# Patient Record
Sex: Female | Born: 1970 | Race: White | Hispanic: No | Marital: Married
Health system: Southern US, Community
[De-identification: ages and names within clinical notes are randomized; demographics above are authoritative.]

---

## 2006-03-09 ENCOUNTER — Emergency Department: Payer: Self-pay | Admitting: Emergency Medicine

## 2006-06-08 ENCOUNTER — Emergency Department: Payer: Self-pay | Admitting: Emergency Medicine

## 2006-09-28 ENCOUNTER — Emergency Department: Payer: Self-pay | Admitting: Emergency Medicine

## 2006-11-10 ENCOUNTER — Other Ambulatory Visit: Payer: Self-pay

## 2006-11-10 ENCOUNTER — Ambulatory Visit: Payer: Self-pay | Admitting: Family Medicine

## 2006-11-10 ENCOUNTER — Emergency Department: Payer: Self-pay | Admitting: Emergency Medicine

## 2007-02-02 ENCOUNTER — Inpatient Hospital Stay: Payer: Self-pay | Admitting: Internal Medicine

## 2007-02-02 ENCOUNTER — Other Ambulatory Visit: Payer: Self-pay

## 2007-02-03 ENCOUNTER — Inpatient Hospital Stay: Payer: Self-pay

## 2007-02-09 ENCOUNTER — Other Ambulatory Visit: Payer: Self-pay

## 2007-02-09 ENCOUNTER — Inpatient Hospital Stay: Payer: Self-pay | Admitting: Unknown Physician Specialty

## 2007-02-10 ENCOUNTER — Inpatient Hospital Stay: Payer: Self-pay | Admitting: Psychiatry

## 2007-06-06 ENCOUNTER — Emergency Department: Payer: Self-pay | Admitting: Emergency Medicine

## 2008-04-18 ENCOUNTER — Observation Stay: Payer: Self-pay | Admitting: Unknown Physician Specialty

## 2008-07-11 ENCOUNTER — Emergency Department: Payer: Self-pay | Admitting: Emergency Medicine

## 2009-02-20 ENCOUNTER — Emergency Department: Payer: Self-pay | Admitting: Internal Medicine

## 2009-06-26 ENCOUNTER — Emergency Department: Payer: Self-pay | Admitting: Emergency Medicine

## 2009-09-01 ENCOUNTER — Emergency Department: Payer: Self-pay | Admitting: Emergency Medicine

## 2009-10-07 ENCOUNTER — Emergency Department: Payer: Self-pay | Admitting: Emergency Medicine

## 2009-11-01 ENCOUNTER — Emergency Department: Payer: Self-pay | Admitting: Emergency Medicine

## 2009-12-08 ENCOUNTER — Ambulatory Visit: Payer: Self-pay

## 2010-02-02 ENCOUNTER — Ambulatory Visit: Payer: Self-pay

## 2010-06-13 ENCOUNTER — Emergency Department: Payer: Self-pay | Admitting: Emergency Medicine

## 2010-07-08 ENCOUNTER — Emergency Department: Payer: Self-pay | Admitting: Emergency Medicine

## 2010-10-05 ENCOUNTER — Emergency Department: Payer: Self-pay | Admitting: Emergency Medicine

## 2010-11-05 ENCOUNTER — Ambulatory Visit: Payer: Self-pay | Admitting: Unknown Physician Specialty

## 2010-12-09 ENCOUNTER — Encounter
Admission: RE | Admit: 2010-12-09 | Discharge: 2010-12-09 | Payer: Self-pay | Source: Home / Self Care | Admitting: Podiatry

## 2011-01-16 ENCOUNTER — Encounter: Payer: Self-pay | Admitting: Podiatry

## 2011-01-18 ENCOUNTER — Ambulatory Visit: Payer: Self-pay | Admitting: Unknown Physician Specialty

## 2011-07-23 IMAGING — CT CT ABD-PELV W/ CM
1 of 2 series · 15 of 32 positions shown, 19 images · non-contrast
Comparison: none

REASON FOR EXAM: (1) abd pain - Right side - large gas on 3 way, eval for
illeus or partial obstr
COMMENTS:

[Series 2: abdomen · axial · 0.72mm/px · z∈[-1005,-575]mm · 15 of 94 slices shown, 19 images]
[im 4/94  soft-tissue]
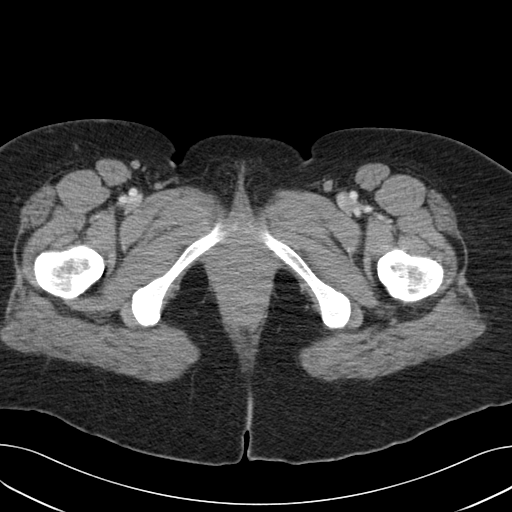
[im 4/94  bone]
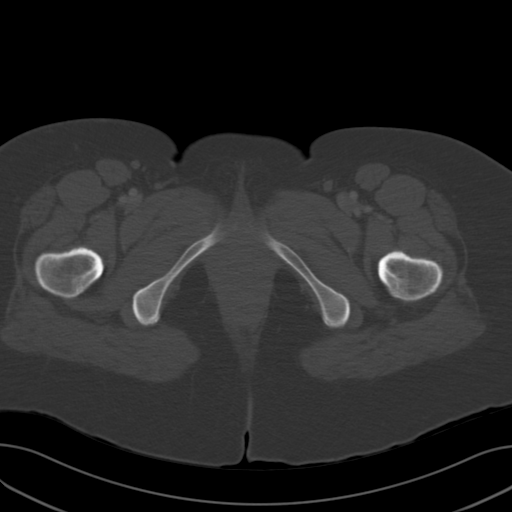
[im 11/94  soft-tissue]
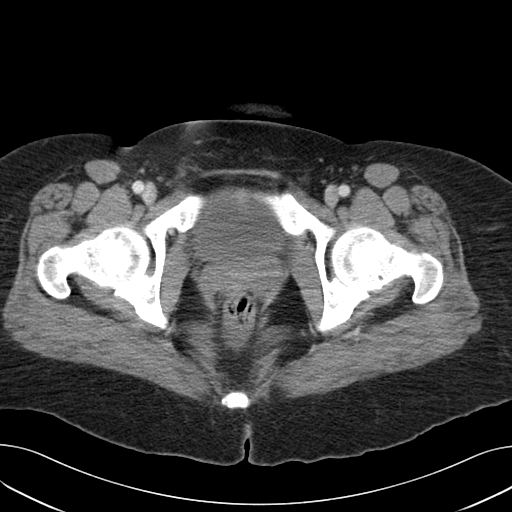
[im 18/94  soft-tissue]
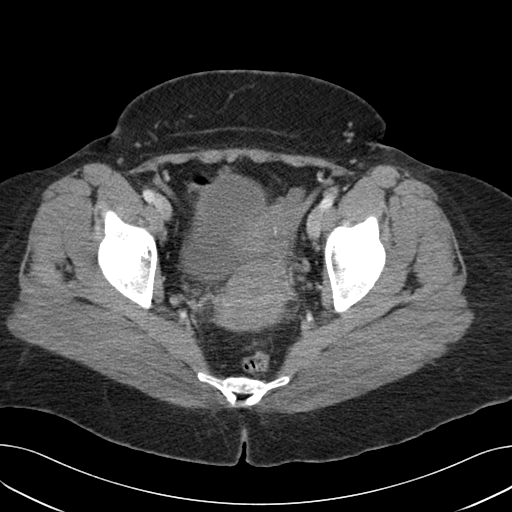
[im 26/94  soft-tissue]
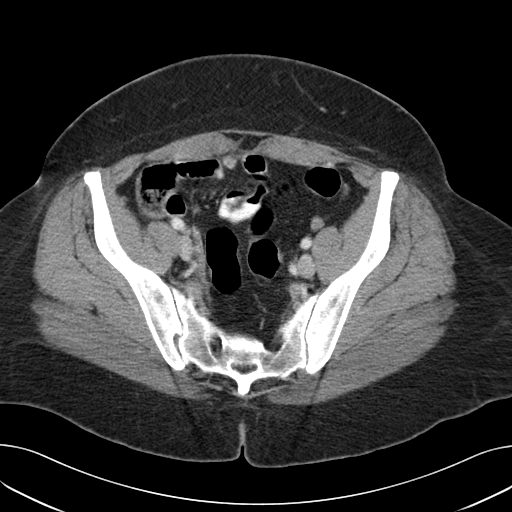
[im 33/94  soft-tissue]
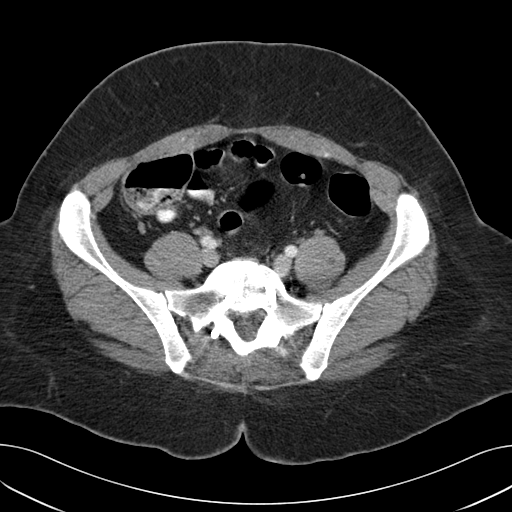
[im 40/94  soft-tissue]
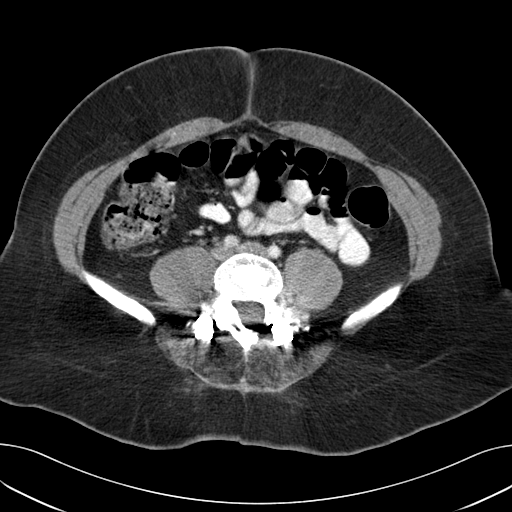
[im 47/94  soft-tissue]
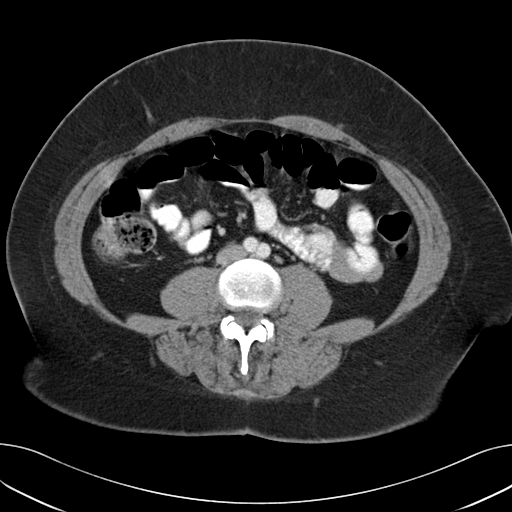
[im 54/94  soft-tissue]
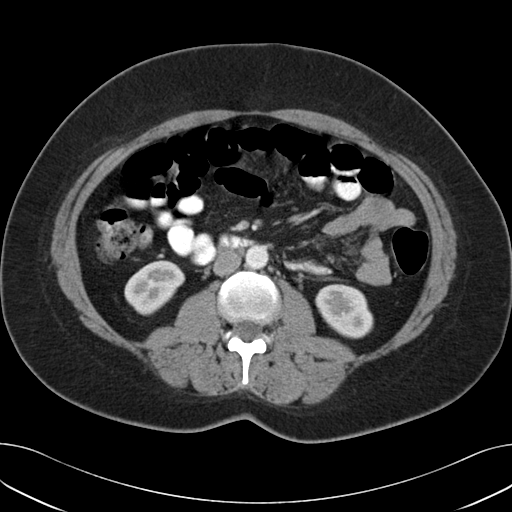
[im 61/94  soft-tissue]
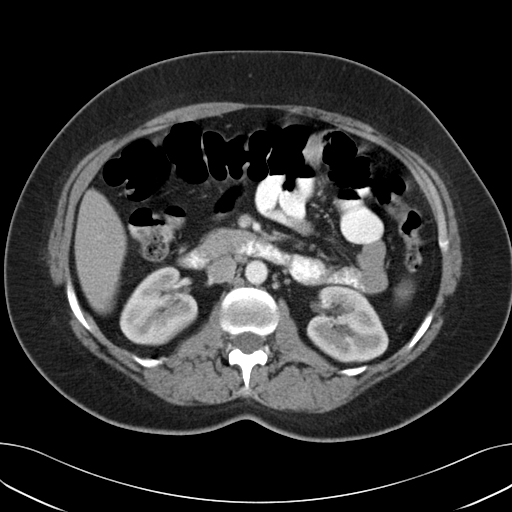
[im 61/94  bone]
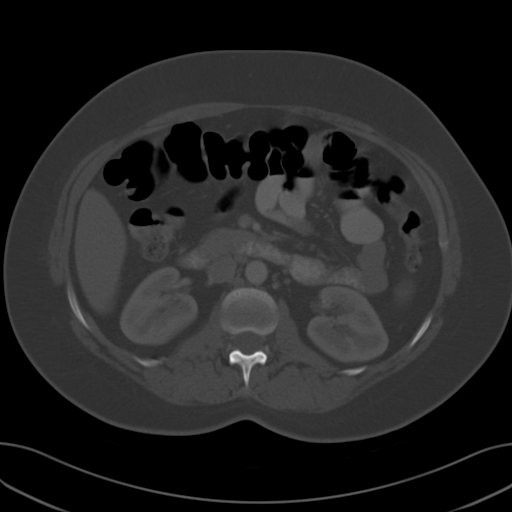
[im 68/94  soft-tissue]
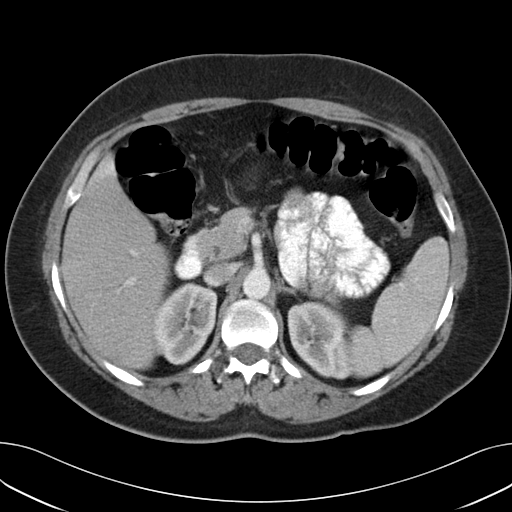
[im 76/94  soft-tissue]
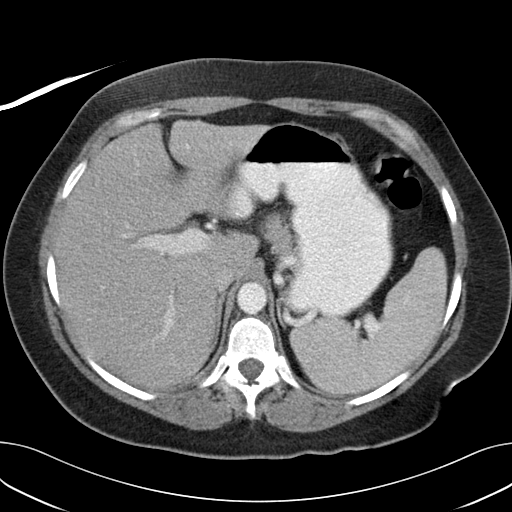
[im 79/94  lung]
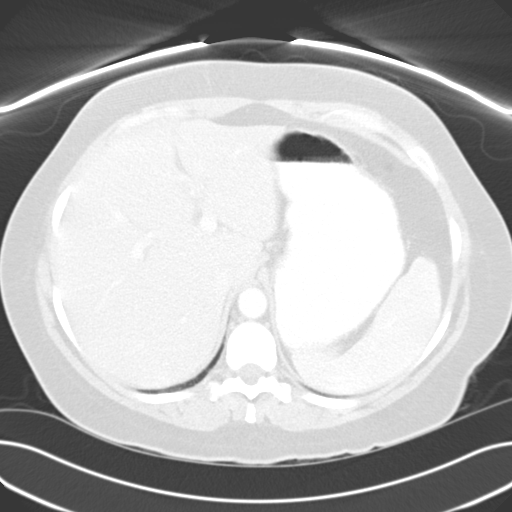
[im 83/94  soft-tissue]
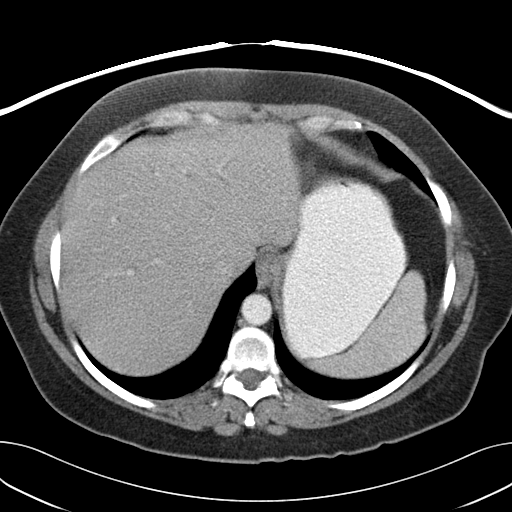
[im 83/94  lung]
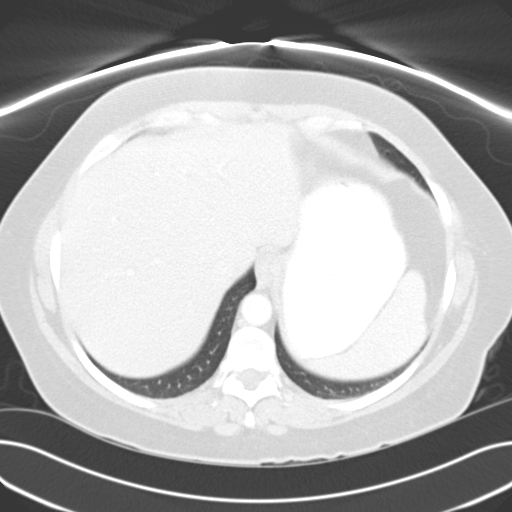
[im 86/94  lung]
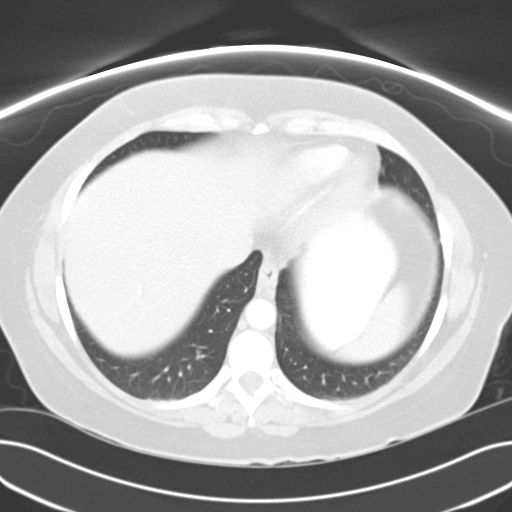
[im 90/94  soft-tissue]
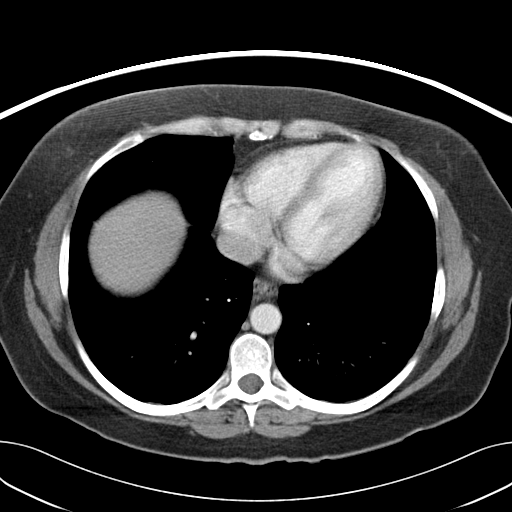
[im 90/94  lung]
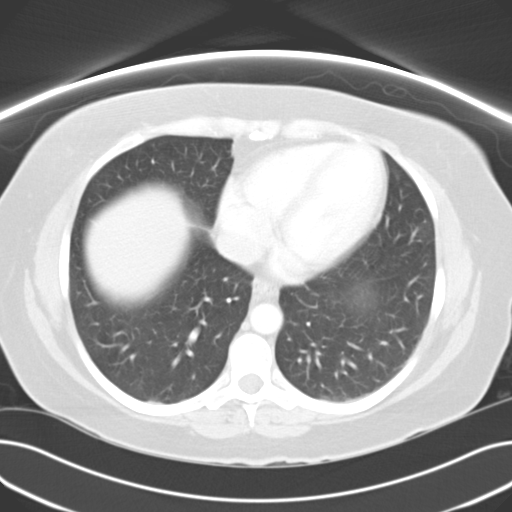

[15 of 32 positions shown; findings below may reference images not displayed]

PROCEDURE:     CT  - CT ABDOMEN / PELVIS  W  - November 02, 2009  [DATE]

RESULT:     Axial CT scanning was performed through the abdomen and pelvis
at 5 mm intervals and slice thicknesses following intravenous administration
of 100 cc of Fsovue-LOC and administration of oral contrast material. The
patient has a history of prior cholecystectomy and appendectomy.

The liver exhibits a small amount of intrahepatic ductal dilation consistent
with the post cholecystectomy state. The pancreas, moderately distended
stomach, spleen, adrenal glands, kidneys, and abdominal aorta are normal in
appearance. The orally administered contrast has traversed a portion of the
small bowel. A small amount is reached has also reached the right colon. The
stool and gas pattern within the small and large bowel is normal. The
partially distended urinary bladder is normal in appearance. In the right
adnexal region there is a complex appearing structure measuring
approximately 2.5 x 3.5 cm which may reflect a cystic ovarian process. The
uterus is grossly normal in appearance. There is a small amount of air
within the vagina. The left adnexal structures are normal in appearance. The
lung bases are clear. The patient has undergone spinal fusion posteriorly
from L4 through S1.
IMPRESSION: 1. There is a small but complex appearing cystic right ovarian process. This
may reflect a septated ovarian cyst or possibly hemorrhagic cyst. Other
etiologies including tubo-ovarian abscess or ectopic pregnancy must be
considered but can be correlated with clinical and laboratory evaluation.
2. There is no evidence of acute bowel abnormality nor acute hepatobiliary
abnormality nor acute urinary tract abnormality.

A preliminary report was sent to the [HOSPITAL] the conclusion
of the study.

## 2012-05-05 ENCOUNTER — Emergency Department: Payer: Self-pay | Admitting: *Deleted

## 2012-05-05 LAB — PREGNANCY, URINE: Pregnancy Test, Urine: NEGATIVE m[IU]/mL

## 2012-08-08 ENCOUNTER — Ambulatory Visit: Payer: Self-pay | Admitting: Family Medicine

## 2012-09-18 ENCOUNTER — Ambulatory Visit: Payer: Self-pay

## 2012-09-20 ENCOUNTER — Ambulatory Visit: Payer: Self-pay | Admitting: Family Medicine

## 2012-09-21 ENCOUNTER — Ambulatory Visit: Payer: Self-pay

## 2012-10-09 ENCOUNTER — Ambulatory Visit: Payer: Self-pay | Admitting: Pain Medicine

## 2013-01-02 ENCOUNTER — Ambulatory Visit: Payer: Self-pay | Admitting: Obstetrics and Gynecology

## 2013-01-02 LAB — HEMOGLOBIN: HGB: 12.8 g/dL (ref 12.0–16.0)

## 2013-01-02 LAB — BASIC METABOLIC PANEL
Anion Gap: 9 (ref 7–16)
BUN: 6 mg/dL — ABNORMAL LOW (ref 7–18)
Calcium, Total: 8.7 mg/dL (ref 8.5–10.1)
Chloride: 108 mmol/L — ABNORMAL HIGH (ref 98–107)
Co2: 22 mmol/L (ref 21–32)
EGFR (African American): >60
EGFR (Non-African Amer.): >60
Potassium: 4.1 mmol/L (ref 3.5–5.1)
Sodium: 139 mmol/L (ref 136–145)

## 2013-01-07 ENCOUNTER — Inpatient Hospital Stay: Payer: Self-pay | Admitting: Obstetrics and Gynecology

## 2013-01-08 LAB — BASIC METABOLIC PANEL
BUN: 12 mg/dL (ref 7–18)
Calcium, Total: 8.9 mg/dL (ref 8.5–10.1)
Co2: 25 mmol/L (ref 21–32)
EGFR (African American): >60
EGFR (Non-African Amer.): >60
Potassium: 4 mmol/L (ref 3.5–5.1)

## 2013-01-08 LAB — HEMATOCRIT: HCT: 31.9 % — ABNORMAL LOW (ref 35.0–47.0)

## 2013-03-28 ENCOUNTER — Ambulatory Visit: Payer: Self-pay | Admitting: Pain Medicine

## 2015-04-17 NOTE — Op Note (Signed)
PATIENT NAME:  Jean Garner, Jean Garner MR#:  [id number] DATE OF BIRTH:  08-22-1971  DATE OF PROCEDURE:  01/07/2013  PREOPERATIVE DIAGNOSES:  1. Menorrhagia.  2. Strong family history for breast cancer.   POSTOPERATIVE DIAGNOSES: 1. Menorrhagia. 2. Strong family history for breast cancer.  PROCEDURE: Total abdominal hysterectomy and bilateral salpingo-oophorectomy.   SURGEON: Boykin Nearing, MD   FIRST ASSISTANTAmalia Hailey   ANESTHESIA: General endotracheal anesthesia.  INDICATIONS: This is a 44 year old gravida 6, para 5 patient with a long history of menorrhagia, failing conservative treatment. The patient has 6 family members that have either breast or ovarian cancer. The patient was deemed to be at increased risk for carrying the BRCA1 or 2 gene defect.   PROCEDURE: After adequate general endotracheal anesthesia, the patient was placed in the dorsal supine position, legs in the [place] stirrups. The patient's abdomen and vagina were prepped in normal sterile fashion, a Foley catheter placed. The patient did receive 2 grams IV cefoxitin prior to commencement of the case. A Pfannenstiel incision was made 2 fingerbreadths above the symphysis pubis. Sharp dissection was used to identify the fascia. The fascia was opened in the midline and opened in a transverse fashion bilaterally. The superior aspect of the fascia was grasped with Kocher clamps and the recti muscles dissected free. The inferior aspect of the fascia was grasped with Kocher clamps, and the pyramidalis muscle was dissected free. Entry into the peritoneal cavity was accomplished sharply. Omental adhesions were encountered. These were taken down sharply. The O'Connor-O'Sullivan retractor was brought up on the operative field, and the bowel was packed cephalad. More omental adhesions and small bowel adhesions to the posterior aspect of the uterus. These were taken down sharply. Two large Kelly clamps were placed on the uterine cornua,  used for retraction. The round ligaments on both sides were clamped, transected, and suture ligated with 0 Vicryl suture. The anterior leaf of the broad ligament was incised along the bladder reflection to the midline for both sides. The bladder was then gently dissected off the lower uterine segment with a sponge stick and sharp dissection. The infundibulopelvic ligaments were doubly clamped, transected, and suture ligated bilaterally, 0 Vicryl suture used. Good hemostasis was noted. Uterine arteries were then skeletonized bilaterally, clamped with Heaney clamps, transected, and suture ligated with 0 Vicryl suture. Good hemostasis was noted. Uterosacral ligaments were then clamped on both sides, transected, and suture ligated. The vaginal angles were then bilaterally clamped, transected, and the uterus was delivered. The vaginal angles were closed with 0 Vicryl suture, and the rest of the vaginal cuff was closed with 0 Vicryl suture. Good hemostasis was noted. There was normal peristalsis of the ureters bilaterally. The patient's abdomen was copiously irrigated with good hemostasis noted. All laparotomy sponges were removed. The fascia was closed with 0 Vicryl suture in a running nonlocking fashion. Subcutaneous tissues were irrigated and bovied for hemostasis. Given the depth of the subcutaneous fat, this area was closed with a 2-0 chromic running suture to close dead space, and the skin was reapproximated with staples. A Graves speculum was placed into the vagina at the end of the case to ensure no active bleeding from the vaginal cuff. There was no bleeding noted. The patient tolerated the procedure well. Estimated blood loss was 200 mL, intraoperative fluids 1800 mL, and urine output 250 mL. The patient was taken to the recovery room in good condition.   ____________________________ Boykin Nearing, MD tjs:cb D: 01/07/2013 10:15:49 ET T: 01/07/2013 11:25:31  ET JOB#: [id number]  cc: Boykin Nearing, MD, <Dictator> Boykin Nearing MD ELECTRONICALLY SIGNED 01/10/2013 10:54
